# Patient Record
Sex: Male | Born: 2000 | Race: Black or African American | Hispanic: No | Marital: Single | Smoking: Current some day smoker
Health system: Southern US, Community
[De-identification: ages and names within clinical notes are randomized; demographics above are authoritative.]

## PROBLEM LIST (undated history)

## (undated) DIAGNOSIS — B20 Human immunodeficiency virus [HIV] disease: Secondary | ICD-10-CM

## (undated) DIAGNOSIS — Z21 Asymptomatic human immunodeficiency virus [HIV] infection status: Secondary | ICD-10-CM

---
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
## 2020-01-27 ENCOUNTER — Other Ambulatory Visit: Payer: Self-pay

## 2020-01-27 DIAGNOSIS — Z008 Encounter for other general examination: Secondary | ICD-10-CM | POA: Insufficient documentation

## 2020-01-27 DIAGNOSIS — R439 Unspecified disturbances of smell and taste: Secondary | ICD-10-CM | POA: Insufficient documentation

## 2020-01-27 DIAGNOSIS — Z5321 Procedure and treatment not carried out due to patient leaving prior to being seen by health care provider: Secondary | ICD-10-CM | POA: Diagnosis not present

## 2020-01-27 NOTE — ED Triage Notes (Signed)
Pt arrive to ED via POV from home with c/o "no taste or smell" since last "Thursday or Friday". Pt denies CP, SHOB. No c/o N/V/D or fever. Pt is here with his s/o other and requesting a COVID test. Pt informed at the registration desk that COVID testing is not normally done in the ED for routine screening reasons, at which point pt decided to check in "with symptoms" Pt is A&O, in NAD; RR even, regular, and unlabored.

## 2020-01-28 ENCOUNTER — Emergency Department
Admission: EM | Admit: 2020-01-28 | Discharge: 2020-01-28 | Disposition: A | Discharge status: Home / Self Care | Payer: Medicaid Other | Attending: Emergency Medicine | Admitting: Emergency Medicine

## 2020-01-28 HISTORY — DX: Asymptomatic human immunodeficiency virus (hiv) infection status: Z21

## 2020-01-28 HISTORY — DX: Human immunodeficiency virus (HIV) disease: B20

## 2020-07-13 ENCOUNTER — Other Ambulatory Visit
Admission: RE | Admit: 2020-07-13 | Discharge: 2020-07-13 | Disposition: A | Discharge status: Home / Self Care | Payer: Medicaid Other | Attending: Thoracic Surgery | Admitting: Thoracic Surgery

## 2020-07-13 ENCOUNTER — Other Ambulatory Visit: Payer: Self-pay

## 2020-07-13 DIAGNOSIS — F909 Attention-deficit hyperactivity disorder, unspecified type: Secondary | ICD-10-CM | POA: Diagnosis not present

## 2020-07-13 DIAGNOSIS — Z21 Asymptomatic human immunodeficiency virus [HIV] infection status: Secondary | ICD-10-CM | POA: Diagnosis not present

## 2020-07-13 DIAGNOSIS — Z0271 Encounter for disability determination: Secondary | ICD-10-CM | POA: Diagnosis not present

## 2020-07-14 LAB — HELPER T-LYMPH-CD4 (ARMC ONLY)
% CD 4 Pos. Lymph.: 28.3 % — ABNORMAL LOW (ref 30.8–58.5)
Absolute CD 4 Helper: 594 /uL (ref 359–1519)
Basophils Absolute: 0 10*3/uL (ref 0.0–0.2)
Basos: 1 %
EOS (ABSOLUTE): 0 10*3/uL (ref 0.0–0.4)
Eos: 0 %
Hematocrit: 41.6 % (ref 37.5–51.0)
Hemoglobin: 13.9 g/dL (ref 13.0–17.7)
Immature Grans (Abs): 0 10*3/uL (ref 0.0–0.1)
Immature Granulocytes: 0 %
Lymphocytes Absolute: 2.1 10*3/uL (ref 0.7–3.1)
Lymphs: 33 %
MCH: 27 pg (ref 26.6–33.0)
MCHC: 33.4 g/dL (ref 31.5–35.7)
MCV: 81 fL (ref 79–97)
Monocytes Absolute: 0.7 10*3/uL (ref 0.1–0.9)
Monocytes: 10 %
Neutrophils Absolute: 3.6 10*3/uL (ref 1.4–7.0)
Neutrophils: 56 %
Platelets: 352 10*3/uL (ref 150–450)
RBC: 5.14 x10E6/uL (ref 4.14–5.80)
RDW: 14.6 % (ref 11.6–15.4)
WBC: 6.4 10*3/uL (ref 3.4–10.8)

## 2020-08-07 ENCOUNTER — Other Ambulatory Visit
Admission: RE | Admit: 2020-08-07 | Discharge: 2020-08-07 | Disposition: A | Discharge status: Home / Self Care | Payer: Medicaid Other | Source: Ambulatory Visit | Attending: Thoracic Surgery | Admitting: Thoracic Surgery

## 2020-08-07 DIAGNOSIS — Z0271 Encounter for disability determination: Secondary | ICD-10-CM | POA: Insufficient documentation

## 2020-08-07 DIAGNOSIS — Z21 Asymptomatic human immunodeficiency virus [HIV] infection status: Secondary | ICD-10-CM | POA: Insufficient documentation

## 2020-08-07 DIAGNOSIS — F909 Attention-deficit hyperactivity disorder, unspecified type: Secondary | ICD-10-CM | POA: Diagnosis not present

## 2020-08-08 LAB — MISC LABCORP TEST (SEND OUT)
LabCorp test name: 505008
Labcorp test code: 505008

## 2020-09-25 ENCOUNTER — Emergency Department
Admission: EM | Admit: 2020-09-25 | Discharge: 2020-09-25 | Disposition: A | Discharge status: Home / Self Care | Payer: Medicaid Other | Attending: Emergency Medicine | Admitting: Emergency Medicine

## 2020-09-25 ENCOUNTER — Other Ambulatory Visit: Payer: Self-pay

## 2020-09-25 ENCOUNTER — Encounter: Payer: Self-pay | Admitting: Intensive Care

## 2020-09-25 DIAGNOSIS — F17210 Nicotine dependence, cigarettes, uncomplicated: Secondary | ICD-10-CM | POA: Diagnosis not present

## 2020-09-25 DIAGNOSIS — Z4802 Encounter for removal of sutures: Secondary | ICD-10-CM | POA: Diagnosis not present

## 2020-09-25 NOTE — ED Provider Notes (Addendum)
[facility] Emergency Department Provider Note  ____________________________________________   First MD Initiated Contact with Patient 09/25/20 1828     (approximate)  I have reviewed the triage vital signs and the nursing notes.   HISTORY  Chief Complaint Suture / Staple Removal   HPI Todd Dodson is a 19 y.o. male with past medical history of HIV who presents requesting suture removal after he had sutures placed in the fourth right digit for an isolated laceration sustained approximately 2 weeks ago. Patient denies any other injuries at that time states he is otherwise been in his usual state of health has not had any recent fevers, chills, redness around the site where the sutures were placed, weakness, numbness, cough, chest pain abdominal pain, vomiting, diarrhea, or other acute complaints.         Past Medical History:  Diagnosis Date  . HIV (human immunodeficiency virus infection) [facility])     Patient Active Problem List   Diagnosis Date Noted  . Lower urinary tract infectious disease 09/07/2019  . Elevated lipase 09/07/2019    History reviewed. No pertinent surgical history.  Prior to Admission medications   Medication Sig Start Date End Date Taking? Authorizing Provider  cyclobenzaprine (FLEXERIL) 5 MG tablet Take 1 tablet (5 mg total) by mouth 3 (three) times daily as needed for muscle spasms. 08/10/17   Cuthriell, Delorise Royals, PA-C  naproxen (NAPROSYN) 500 MG tablet Take 1 tablet (500 mg total) by mouth 2 (two) times daily with a meal. 08/10/17   Cuthriell, Delorise Royals, PA-C    Allergies Patient has no known allergies.  History reviewed. No pertinent family history.  Social History Social History   Tobacco Use  . Smoking status: Current Some Day Smoker    Types: Cigarettes  . Smokeless tobacco: Never Used  Substance Use Topics  . Alcohol use: Not Currently  . Drug use: Never    Review of Systems  Review of Systems    Constitutional: Negative for chills and fever.  HENT: Negative for sore throat.   Eyes: Negative for pain.  Respiratory: Negative for cough and stridor.   Cardiovascular: Negative for chest pain.  Gastrointestinal: Negative for vomiting.  Genitourinary: Negative for dysuria.  Skin: Negative for rash.  Neurological: Negative for seizures, loss of consciousness and headaches.  Psychiatric/Behavioral: Negative for suicidal ideas.  All other systems reviewed and are negative.     ____________________________________________   PHYSICAL EXAM:  VITAL SIGNS: ED Triage Vitals [09/25/20 1646]  Enc Vitals Group     BP 125/66     Pulse Rate 71     Resp 16     Temp 98.5 F (36.9 C)     Temp Source Oral     SpO2 100 %     Weight 168 lb (76.2 kg)     Height 6\' 3"  (1.905 m)     Head Circumference      Peak Flow      Pain Score 0     Pain Loc      Pain Edu?      Excl. in GC?    Vitals:   09/25/20 1646  BP: 125/66  Pulse: 71  Resp: 16  Temp: 98.5 F (36.9 C)  SpO2: 100%   Physical Exam Vitals and nursing note reviewed.  Constitutional:      Appearance: He is well-developed.  HENT:     Head: Normocephalic and atraumatic.     Right Ear: External ear normal.  Left Ear: External ear normal.     Nose: Nose normal.  Eyes:     Conjunctiva/sclera: Conjunctivae normal.  Cardiovascular:     Rate and Rhythm: Normal rate and regular rhythm.     Pulses: Normal pulses.  Pulmonary:     Effort: Pulmonary effort is normal. No respiratory distress.  Abdominal:     Palpations: Abdomen is soft.     Tenderness: There is no abdominal tenderness.  Musculoskeletal:     Cervical back: Neck supple.  Skin:    General: Skin is warm and dry.     Capillary Refill: Capillary refill takes less than 2 seconds.  Neurological:     Mental Status: He is alert and oriented to person, place, and time.  Psychiatric:        Mood and Affect: Mood normal.     7 sutures were in place over a  linear well-healing laceration over the dorsum of the right fourth digit that just crosses the proximal phalangeal joint. Patient is able to flex and extend the digit against resistance. No other trauma visible to any other digits or in the hand. Sensation intact in the distribution of the radial ulnar and median nerves. No fluctuance streaking warmth or other overlying skin changes. ____________________________________________    ____________________________________________   PROCEDURES  Procedure(s) performed (including Critical Care):  .Suture Removal  Date/Time: 09/25/2020 6:38 PM Performed by: Gilles Chiquito, MD Authorized by: Gilles Chiquito, MD   Consent:    Consent obtained:  Verbal   Consent given by:  Patient   Risks discussed:  Pain and wound separation   Alternatives discussed:  No treatment and referral Location:    Location:  Upper extremity   Upper extremity location:  Hand   Hand location:  R ring finger Procedure details:    Wound appearance:  No signs of infection   Number of sutures removed:  7 Post-procedure details:    Patient tolerance of procedure:  Tolerated well, no immediate complications Comments:     Like appears approximately 1 cm in length.     ____________________________________________   INITIAL IMPRESSION / ASSESSMENT AND PLAN / ED COURSE      Patient presents above to history exam for assessment requesting suture removal after he had these placed proximally 2 weeks ago for an isolated left over his right fourth digit. Patient is afebrile hemodynamically stable arrival. No signs of infection on exam and patient is otherwise neurovascular intact in the hand. Sutures removed per procedure note above. Patient discharged stable condition. Strict return precautions advised and discussed.   ____________________________________________   FINAL CLINICAL IMPRESSION(S) / ED DIAGNOSES  Final diagnoses:  Visit for suture removal     Medications - No data to display   ED Discharge Orders    None       Note:  This document was prepared using Dragon voice recognition software and may include unintentional dictation errors.   Gilles Chiquito, MD 09/25/20 1839    Gilles Chiquito, MD 09/25/20 1840

## 2020-09-25 NOTE — ED Triage Notes (Signed)
Patient here for suture removal.

## 2021-02-02 ENCOUNTER — Emergency Department: Payer: Medicaid Other

## 2021-02-02 ENCOUNTER — Encounter: Payer: Self-pay | Admitting: Emergency Medicine

## 2021-02-02 ENCOUNTER — Other Ambulatory Visit: Payer: Self-pay

## 2021-02-02 ENCOUNTER — Emergency Department
Admission: EM | Admit: 2021-02-02 | Discharge: 2021-02-02 | Disposition: A | Discharge status: Home / Self Care | Payer: Medicaid Other | Attending: Emergency Medicine | Admitting: Emergency Medicine

## 2021-02-02 DIAGNOSIS — Z21 Asymptomatic human immunodeficiency virus [HIV] infection status: Secondary | ICD-10-CM | POA: Diagnosis not present

## 2021-02-02 DIAGNOSIS — S0993XA Unspecified injury of face, initial encounter: Secondary | ICD-10-CM | POA: Diagnosis present

## 2021-02-02 DIAGNOSIS — S0302XA Dislocation of jaw, left side, initial encounter: Secondary | ICD-10-CM | POA: Insufficient documentation

## 2021-02-02 DIAGNOSIS — S0300XA Dislocation of jaw, unspecified side, initial encounter: Secondary | ICD-10-CM

## 2021-02-02 DIAGNOSIS — H1031 Unspecified acute conjunctivitis, right eye: Secondary | ICD-10-CM | POA: Diagnosis not present

## 2021-02-02 DIAGNOSIS — F17210 Nicotine dependence, cigarettes, uncomplicated: Secondary | ICD-10-CM | POA: Diagnosis not present

## 2021-02-02 MED ORDER — TETRACAINE HCL 0.5 % OP SOLN
2.0000 [drp] | Freq: Once | OPHTHALMIC | Status: AC
  Administered 2021-02-02: 2 [drp] via OPHTHALMIC
  Filled 2021-02-02: qty 4

## 2021-02-02 MED ORDER — FLUORESCEIN SODIUM 1 MG OP STRP
1.0000 | ORAL_STRIP | Freq: Once | OPHTHALMIC | Status: AC
  Administered 2021-02-02: 1 via OPHTHALMIC
  Filled 2021-02-02: qty 1

## 2021-02-02 MED ORDER — KETOROLAC TROMETHAMINE 0.5 % OP SOLN: 1.0000 [drp] | Freq: Four times a day (QID) | OPHTHALMIC | 0 refills | Status: DC

## 2021-02-02 MED ORDER — GENTAMICIN SULFATE 0.3 % OP SOLN: 1.0000 [drp] | Freq: Three times a day (TID) | OPHTHALMIC | 0 refills | Status: AC

## 2021-02-02 NOTE — Discharge Instructions (Addendum)
You are being treated for a bacterial conjunctivitis. Use the antibiotic drops and anti-inflammatory drops as directed. Follow-up with [facility] as needed. See [facility] for your jaw pain. Return to the ED if necessary.
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
## 2021-02-02 NOTE — ED Notes (Addendum)
See triage note  presents with irritation to right eye   Right eye is red with some swelling  Thinks he may have slept with his contacts   States he knew that the left contact fell out but unsure of right
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
## 2021-02-02 NOTE — ED Triage Notes (Signed)
Wears contacts, sometimes sleeps with contacts in.  Unsure if contact in still in eye.  Today c/o eye swelling and redness.  AAOx3.  Skin warm and dry. NAD
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
## 2022-02-09 ENCOUNTER — Other Ambulatory Visit: Payer: Self-pay

## 2022-02-09 ENCOUNTER — Ambulatory Visit: Payer: Medicaid Other | Admitting: Family Medicine

## 2022-02-09 ENCOUNTER — Encounter: Payer: Self-pay | Admitting: Family Medicine

## 2022-02-09 DIAGNOSIS — F32A Depression, unspecified: Secondary | ICD-10-CM

## 2022-02-09 DIAGNOSIS — Z113 Encounter for screening for infections with a predominantly sexual mode of transmission: Secondary | ICD-10-CM

## 2022-02-09 DIAGNOSIS — A539 Syphilis, unspecified: Secondary | ICD-10-CM

## 2022-02-09 DIAGNOSIS — F419 Anxiety disorder, unspecified: Secondary | ICD-10-CM

## 2022-02-09 MED ORDER — PENICILLIN G BENZATHINE 2400000 UNIT/4ML IM SUSP: 2.4000 10*6.[IU] | Freq: Once | INTRAMUSCULAR | Status: DC

## 2022-02-09 MED ORDER — PENICILLIN G BENZATHINE 1200000 UNIT/2ML IM SUSY
2.4000 10*6.[IU] | PREFILLED_SYRINGE | Freq: Once | INTRAMUSCULAR | Status: AC
  Administered 2022-02-09: 2.4 10*6.[IU] via INTRAMUSCULAR

## 2022-02-09 NOTE — Progress Notes (Signed)
Pt here for treatment of Syphilis.  Bicillin 2.4 MU given IM without any complications.  Condoms given.  Berdie Ogren, RN

## 2022-02-09 NOTE — Progress Notes (Signed)
°  S: Patient in clinic today for treatment of syphilis.  Pt was sent for treatment from  ID provider ([facility]).    O: RPR 1:4  on 02/02/2021, previous result on 07/26/2021 was non reactive.    A: 1. Screening examination for venereal disease      2. Anxiety and depression       - Ambulatory referral to Behavioral Health      3. Syphilis       - penicillin g benzathine (BICILLIN LA) 1200000 UNIT/2ML injection 2.4 Million Units  P:Pt to be treated with 2.4 million units Bicilin IM.  Patient tolerated treatment well. Pt monitored for 15 minutes. Pt to be re-screened in 6 months.  Instructed no sex until 14 days after treatment and partner treated.   Patient to call if questions or concerns.    Wendi Snipes, FNP

## 2022-03-10 ENCOUNTER — Ambulatory Visit: Payer: Disability Insurance | Admitting: Licensed Clinical Social Worker

## 2022-04-23 ENCOUNTER — Emergency Department: Payer: Medicaid Other

## 2022-04-23 ENCOUNTER — Encounter: Payer: Self-pay | Admitting: Emergency Medicine

## 2022-04-23 ENCOUNTER — Emergency Department
Admission: EM | Admit: 2022-04-23 | Discharge: 2022-04-23 | Disposition: A | Discharge status: Home / Self Care | Payer: Medicaid Other | Attending: Emergency Medicine | Admitting: Emergency Medicine

## 2022-04-23 ENCOUNTER — Other Ambulatory Visit: Payer: Self-pay

## 2022-04-23 DIAGNOSIS — A549 Gonococcal infection, unspecified: Secondary | ICD-10-CM | POA: Insufficient documentation

## 2022-04-23 DIAGNOSIS — A749 Chlamydial infection, unspecified: Secondary | ICD-10-CM | POA: Insufficient documentation

## 2022-04-23 DIAGNOSIS — S5001XA Contusion of right elbow, initial encounter: Secondary | ICD-10-CM | POA: Diagnosis not present

## 2022-04-23 DIAGNOSIS — W109XXA Fall (on) (from) unspecified stairs and steps, initial encounter: Secondary | ICD-10-CM | POA: Insufficient documentation

## 2022-04-23 DIAGNOSIS — S59901A Unspecified injury of right elbow, initial encounter: Secondary | ICD-10-CM | POA: Diagnosis present

## 2022-04-23 LAB — CHLAMYDIA/NGC RT PCR (ARMC ONLY)
Chlamydia Tr: DETECTED — AB
N gonorrhoeae: DETECTED — AB

## 2022-04-23 MED ORDER — DOXYCYCLINE HYCLATE 100 MG PO TABS: 100.0000 mg | ORAL_TABLET | Freq: Two times a day (BID) | ORAL | 0 refills | Status: AC

## 2022-04-23 MED ORDER — CEFTRIAXONE SODIUM 1 G IJ SOLR
1.0000 g | Freq: Once | INTRAMUSCULAR | Status: AC
  Administered 2022-04-23: 1 g via INTRAMUSCULAR
  Filled 2022-04-23: qty 10

## 2022-04-23 NOTE — ED Triage Notes (Signed)
Pt reports fell 2 days ago while helping his mother move and his right elbow is still numb and tingling. Pt also reports would like to get a STD check.  ?

## 2022-04-23 NOTE — Discharge Instructions (Signed)
You were seen today for right elbow pain and swelling.  Your x-ray did not show any evidence of acute fracture.  This is likely just soft tissue injury and bruising.  You may apply ice for 10 minutes 2 times daily to help reduce pain and swelling.  You tested positive for gonorrhea and chlamydia.  You received a Rocephin injection and are being put on an oral antibiotic twice daily for the next 14 days.  Please do not have sexual intercourse for the next week.  When you resume sexual activity, please use safe sexual practices. ?

## 2022-04-23 NOTE — ED Provider Notes (Signed)
? ?[facility] ?Provider Note ? ? ? Event Date/Time  ? First MD Initiated Contact with Patient 04/23/22 1345   ?  (approximate) ? ? ?History  ? ?Fall, Arm Injury, and SEXUALLY TRANSMITTED DISEASE ? ? ?HPI ? ?Todd Dodson is a 21 y.o. male presents to the ER today with complaint of right elbow pain and swelling.  He reports this started 2 days ago after falling down approximately 4 steps.  He reports he landed directly on his right elbow.  He describes the pain as sharp with associated numbness and tingling.  He denies weakness.  He denies any shoulder or wrist pain on the right side.  He did apply some ice to the area with minimal relief of symptoms.  He has not taken anything OTC for this. ? ?He would also like STD screening.  He reports positive exposure to chlamydia.  He reports penile discharge but denies lower abdominal pain, urinary frequency, urgency, dysuria, penile lesion, testicular pain or swelling.  He is HIV positive. ? ?  ? ? ?Physical Exam  ? ?Triage Vital Signs: ?ED Triage Vitals  ?Enc Vitals Group  ?   BP 04/23/22 1344 123/72  ?   Pulse Rate 04/23/22 1344 71  ?   Resp 04/23/22 1344 16  ?   Temp 04/23/22 1344 98.2 ?F (36.8 ?C)  ?   Temp Source 04/23/22 1344 Oral  ?   SpO2 04/23/22 1344 99 %  ?   Weight 04/23/22 1340 165 lb (74.8 kg)  ?   Height 04/23/22 1340 6\' 3"  (1.905 m)  ?   Head Circumference --   ?   Peak Flow --   ?   Pain Score 04/23/22 1340 0  ?   Pain Loc --   ?   Pain Edu? --   ?   Excl. in GC? --   ? ? ?Most recent vital signs: ?Vitals:  ? 04/23/22 1344  ?BP: 123/72  ?Pulse: 71  ?Resp: 16  ?Temp: 98.2 ?F (36.8 ?C)  ?SpO2: 99%  ? ? ?General: Awake, no distress.  ?CV:  RRR, radial pulse 2+ on the right ?Resp:  CTA bilaterally. ?MSK:  Normal flexion, extension and rotation of the right elbow.  1+ swelling noted over the right elbow.  Pain with palpation over the olecranon. ?Abd:  Soft and nontender. ?GU:  Normal male anatomy.  Green discharge noted from the urethral  meatus. ? ? ?ED Results / Procedures / Treatments  ? ?Labs ? ?Labs Reviewed  ?CHLAMYDIA/NGC RT PCR ([facility] ONLY)           - Abnormal; Notable for the following components:  ?    Result Value  ? Chlamydia Tr DETECTED (*)   ? N gonorrhoeae DETECTED (*)   ? All other components within normal limits  ? ? ? ?RADIOLOGY ? ?Imaging Orders    ?     DG Elbow 2 Views Right    ? ?IMPRESSION: Negative.  ? ? ?MEDICATIONS ORDERED IN ED: ? ? ?IMPRESSION / MDM / ASSESSMENT AND PLAN / ED COURSE  ?I reviewed the triage vital signs and the nursing notes. ? ?Right Elbow Pain and Swelling, Penile Discharge: ? ?Differential diagnosis includes, but is not limited to, elbow contusion, elbow fracture, STI, UTI ? ?Xray right elbow does not show any acute fracture but does show soft tissue swelling per my read, confirmed by radiology ?Urine gonorrhea/chlamydia positive for both ?Encouraged ice to the elbow ?Rocephin 1 g IM  x1 ?Rx for Doxycycline 100 mg 2 times a day for 14 days ? ? ?FINAL CLINICAL IMPRESSION(S) / ED DIAGNOSES  ? ?Final diagnoses:  ?Contusion of right elbow, initial encounter  ?Gonorrhea  ?Chlamydia  ? ? ? ?Rx / DC Orders  ? ?ED Discharge Orders   ? ?      Ordered  ?  doxycycline (VIBRA-TABS) 100 MG tablet  2 times daily       ? 04/23/22 1604  ? ?  ?  ? ?  ? ? ? ?Note:  This document was prepared using Dragon voice recognition software and may include unintentional dictation errors. ? ?  ?Lorre Munroe, NP ?04/23/22 1605 ? ?  ?Merwyn Katos, MD ?04/23/22 1642 ? ?

## 2024-01-18 ENCOUNTER — Encounter (HOSPITAL_BASED_OUTPATIENT_CLINIC_OR_DEPARTMENT_OTHER): Payer: Self-pay | Admitting: Emergency Medicine

## 2024-01-18 ENCOUNTER — Other Ambulatory Visit: Payer: Self-pay

## 2024-01-18 ENCOUNTER — Emergency Department (HOSPITAL_BASED_OUTPATIENT_CLINIC_OR_DEPARTMENT_OTHER): Payer: Medicaid Other

## 2024-01-18 ENCOUNTER — Emergency Department (HOSPITAL_BASED_OUTPATIENT_CLINIC_OR_DEPARTMENT_OTHER)
Admission: EM | Admit: 2024-01-18 | Discharge: 2024-01-18 | Disposition: A | Discharge status: Home / Self Care | Payer: Medicaid Other | Attending: Emergency Medicine | Admitting: Emergency Medicine

## 2024-01-18 DIAGNOSIS — Z20822 Contact with and (suspected) exposure to covid-19: Secondary | ICD-10-CM | POA: Insufficient documentation

## 2024-01-18 DIAGNOSIS — J0110 Acute frontal sinusitis, unspecified: Secondary | ICD-10-CM | POA: Diagnosis not present

## 2024-01-18 DIAGNOSIS — Z21 Asymptomatic human immunodeficiency virus [HIV] infection status: Secondary | ICD-10-CM | POA: Diagnosis not present

## 2024-01-18 DIAGNOSIS — R220 Localized swelling, mass and lump, head: Secondary | ICD-10-CM | POA: Diagnosis present

## 2024-01-18 LAB — BASIC METABOLIC PANEL
Anion gap: 8 (ref 5–15)
BUN: <5 mg/dL — ABNORMAL LOW (ref 6–20)
CO2: 28 mmol/L (ref 22–32)
Calcium: 9.4 mg/dL (ref 8.9–10.3)
Chloride: 103 mmol/L (ref 98–111)
Creatinine, Ser: 1.04 mg/dL (ref 0.61–1.24)
GFR, Estimated: >60 mL/min (ref >60)
Glucose, Bld: 89 mg/dL (ref 70–99)
Potassium: 4 mmol/L (ref 3.5–5.1)
Sodium: 139 mmol/L (ref 135–145)

## 2024-01-18 LAB — CBC WITH DIFFERENTIAL/PLATELET
Abs Immature Granulocytes: 0.03 10*3/uL (ref 0.00–0.07)
Basophils Absolute: 0 10*3/uL (ref 0.0–0.1)
Basophils Relative: 0 %
Eosinophils Absolute: 0 10*3/uL (ref 0.0–0.5)
Eosinophils Relative: 0 %
HCT: 42.7 % (ref 39.0–52.0)
Hemoglobin: 13.6 g/dL (ref 13.0–17.0)
Immature Granulocytes: 0 %
Lymphocytes Relative: 16 %
Lymphs Abs: 1.5 10*3/uL (ref 0.7–4.0)
MCH: 25.7 pg — ABNORMAL LOW (ref 26.0–34.0)
MCHC: 31.9 g/dL (ref 30.0–36.0)
MCV: 80.6 fL (ref 80.0–100.0)
Monocytes Absolute: 0.4 10*3/uL (ref 0.1–1.0)
Monocytes Relative: 5 %
Neutro Abs: 7.2 10*3/uL (ref 1.7–7.7)
Neutrophils Relative %: 79 %
Platelets: 370 10*3/uL (ref 150–400)
RBC: 5.3 MIL/uL (ref 4.22–5.81)
RDW: 14.2 % (ref 11.5–15.5)
WBC: 9.3 10*3/uL (ref 4.0–10.5)
nRBC: 0 % (ref 0.0–0.2)

## 2024-01-18 LAB — RESP PANEL BY RT-PCR (RSV, FLU A&B, COVID)  RVPGX2
Influenza A by PCR: NEGATIVE
Influenza B by PCR: NEGATIVE
Resp Syncytial Virus by PCR: NEGATIVE
SARS Coronavirus 2 by RT PCR: NEGATIVE

## 2024-01-18 MED ORDER — AMOXICILLIN-POT CLAVULANATE ER 1000-62.5 MG PO TB12: 2.0000 | ORAL_TABLET | Freq: Two times a day (BID) | ORAL | 0 refills | Status: AC

## 2024-01-18 MED ORDER — IOHEXOL 300 MG/ML  SOLN
100.0000 mL | Freq: Once | INTRAMUSCULAR | Status: AC | PRN
  Administered 2024-01-18: 75 mL via INTRAVENOUS

## 2024-01-18 MED ORDER — KETOROLAC TROMETHAMINE 15 MG/ML IJ SOLN
15.0000 mg | Freq: Once | INTRAMUSCULAR | Status: AC
  Administered 2024-01-18: 15 mg via INTRAVENOUS
  Filled 2024-01-18: qty 1

## 2024-01-18 MED ORDER — AMOXICILLIN-POT CLAVULANATE 875-125 MG PO TABS
1.0000 | ORAL_TABLET | Freq: Two times a day (BID) | ORAL | 0 refills | Status: DC
Start: 2024-01-18 — End: 2024-01-18

## 2024-01-18 NOTE — ED Provider Notes (Signed)
[place] EMERGENCY DEPARTMENT AT [facility] Provider Note   CSN: [id number] Arrival date & time: 01/18/24  1255     History  Chief Complaint  Patient presents with   Headache    Todd Dodson is a 23 y.o. male, Hu of HIV, who presents to the ED secondary to left facial swelling, pain, this been going on for the last day.  He states that it has progressively been getting worse throughout the day, and is states the pain is a 10 out of 10.  He states his pain was similar, when he had a fungal sinus infection a couple years back.  Is on HIV medication, however has not been compliant.  Has missed 2 weeks of HIV medication, and just restarted it last week.  He denies any fevers, chills, but states his headache is severe.     Home Medications Prior to Admission medications   Medication Sig Start Date End Date Taking? Authorizing Provider  amoxicillin-clavulanate (AUGMENTIN) 875-125 MG tablet Take 1 tablet by mouth every 12 (twelve) hours. 01/18/24  Yes Kennith Morss L, PA  Glecaprevir-Pibrentasvir (MAVYRET) 100-40 MG TABS Take 3 tablets by mouth daily. 01/16/24  Yes [provider]  bictegravir-emtricitabine-tenofovir AF (BIKTARVY) 50-200-25 MG TABS tablet Take by mouth daily.    [provider]  doxycycline (VIBRA-TABS) 100 MG tablet Take 1 tablet (100 mg total) by mouth 2 (two) times daily. 04/23/22   Lorre Munroe, NP      Allergies    Patient has no known allergies.    Review of Systems   Review of Systems  Constitutional:  Negative for fever.  Neurological:  Positive for headaches.    Physical Exam Updated Vital Signs BP 128/69 (BP Location: Right Arm)   Pulse 82   Temp 97.7 F (36.5 C) (Oral)   Resp 18   SpO2 100%  Physical Exam Vitals and nursing note reviewed.  Constitutional:      General: He is not in acute distress.    Appearance: He is well-developed.  HENT:     Head: Normocephalic and atraumatic.     Comments: +L frontal sinus  ttp w/L sphenoid sinus ttp. Slight edema to upper eyelid w/o erythema.     Right Ear: Tympanic membrane normal.     Left Ear: Tympanic membrane normal.     Ears:     Comments: No erythema noted Eyes:     Extraocular Movements: Extraocular movements intact.     Conjunctiva/sclera: Conjunctivae normal.     Pupils: Pupils are equal, round, and reactive to light.  Cardiovascular:     Rate and Rhythm: Normal rate and regular rhythm.     Heart sounds: No murmur heard. Pulmonary:     Effort: Pulmonary effort is normal. No respiratory distress.     Breath sounds: Normal breath sounds.  Abdominal:     Palpations: Abdomen is soft.     Tenderness: There is no abdominal tenderness.  Musculoskeletal:        General: No swelling.     Cervical back: Neck supple.  Skin:    General: Skin is warm and dry.     Capillary Refill: Capillary refill takes less than 2 seconds.  Neurological:     Mental Status: He is alert.  Psychiatric:        Mood and Affect: Mood normal.     ED Results / Procedures / Treatments   Labs (all labs ordered are listed, but only abnormal results are displayed) Labs  Reviewed  CBC WITH DIFFERENTIAL/PLATELET - Abnormal; Notable for the following components:      Result Value   MCH 25.7 (*)    All other components within normal limits  BASIC METABOLIC PANEL - Abnormal; Notable for the following components:   BUN <5 (*)    All other components within normal limits  RESP PANEL BY RT-PCR (RSV, FLU A&B, COVID)  [id number]    EKG None  Radiology CT Maxillofacial W Contrast Result Date: 01/18/2024 CLINICAL DATA:  Fungal sinusitis.  Severe headache.  HIV positive. EXAM: CT MAXILLOFACIAL WITH CONTRAST TECHNIQUE: Multidetector CT imaging of the maxillofacial structures was performed with intravenous contrast. Multiplanar CT image reconstructions were also generated. RADIATION DOSE REDUCTION: This exam was performed according to the departmental dose-optimization program which  includes automated exposure control, adjustment of the mA and/or kV according to patient size and/or use of iterative reconstruction technique. CONTRAST:  75mL OMNIPAQUE IOHEXOL 300 MG/ML  SOLN COMPARISON:  CT head without contrast 01/18/2024. CT maxillofacial 02/02/2021. FINDINGS: Osseous: No acute osseous lesions are present. No acute or healing fractures are present. Orbits: Left supraorbital scalp soft tissue swelling is present without focal abscess. Sinuses: Diffuse opacification of the anterior left paranasal sinuses is present. Circumferential mucosal thickening with near complete opacification of the left maxillary sinus is present. Mucosal enhancement is evident. Diffuse scratched at scattered opacification of left ethmoid air cells is present. The left frontal sinus is near completely opacified. No osseous destruction is present. A polyp or mucous retention cyst is present at the inferior right maxillary sinus. The right paranasal sinuses are otherwise clear. Mastoid air cells are clear. Soft tissues: The soft tissues scratched at extracranial soft tissues are otherwise within normal limits. Limited intracranial: No pathologic intracranial enhancement is present. IMPRESSION: 1. Diffuse opacification of the anterior left paranasal sinuses is compatible with sinusitis. 2. No osseous destruction to suggest a more acute fungal sinusitis. 3. Left supraorbital scalp soft tissue swelling without focal abscess. 4. Polyp or mucous retention cyst at the inferior right maxillary sinus. 5. Normal postcontrast appearance of the visualized intracranial contents. Electronically Signed   By: Marin Roberts M.D.   On: 01/18/2024 18:22   CT Head Wo Contrast Result Date: 01/18/2024 CLINICAL DATA:  Headache EXAM: CT HEAD WITHOUT CONTRAST TECHNIQUE: Contiguous axial images were obtained from the base of the skull through the vertex without intravenous contrast. RADIATION DOSE REDUCTION: This exam was performed  according to the departmental dose-optimization program which includes automated exposure control, adjustment of the mA and/or kV according to patient size and/or use of iterative reconstruction technique. COMPARISON:  None Available. FINDINGS: Brain: No acute territorial infarction, hemorrhage or intracranial mass. The ventricles are nonenlarged Vascular: No hyperdense vessel or unexpected calcification. Skull: Normal. Negative for fracture or focal lesion. Sinuses/Orbits: Extensive opacification of the left maxillary, ethmoid and frontal sinus. Other: None IMPRESSION: 1. Negative non contrasted CT appearance of the brain. 2. Extensive left-sided sinus disease. Electronically Signed   By: Jasmine Pang M.D.   On: 01/18/2024 18:15    Procedures Procedures    Medications Ordered in ED Medications  ketorolac (TORADOL) 15 MG/ML injection 15 mg (15 mg Intravenous Given 01/18/24 1702)  iohexol (OMNIPAQUE) 300 MG/ML solution 100 mL (75 mLs Intravenous Contrast Given 01/18/24 1758)    ED Course/ Medical Decision Making/ A&P  Medical Decision Making Patient is a 23 year old male, here for left-sided facial pain, that is been going on for the last day.  He has no fevers, chills.  Notes that his eye is a little swollen.  Denies any vision changes.  He has swelling of his upper eyelid, as well as his brow.  Will obtain a CT max face, and CT head, given history of fungal sinusitis.  He has no fever currently.  Amount and/or Complexity of Data Reviewed Labs: ordered. Radiology: ordered.    Details: Extensive left-sided sinus disease, findings not concerning for fungal sinusitis  Discussion of management or test interpretation with external provider(s): CTs are reassuring, just concerning for bacterial sinusitis, we will treat with Augmentin, high-dose, and have him follow-up with his PCP d/t high risk given hx of HIV, complicated sinusitis, and extensive disease. Discussed  continuing HIV meds and return precautions.   Risk Prescription drug management.    Final Clinical Impression(s) / ED Diagnoses Final diagnoses:  Acute non-recurrent frontal sinusitis    Rx / DC Orders ED Discharge Orders          Ordered    amoxicillin-clavulanate (AUGMENTIN) 875-125 MG tablet  Every 12 hours        01/18/24 1834              Pete Pelt, PA 01/18/24 1840    Benjiman Core, MD 01/18/24 2104

## 2024-01-18 NOTE — Discharge Instructions (Signed)
You have a sinus infection, you are HIV positive, thus it is very important that you follow-up with a primary care doctor, or your infectious disease doctor, to make sure that the symptoms are getting better.  If they are getting worse, you may need IV antibiotics.  Take antibiotics as prescribed, and continue taking your HIV medication

## 2024-01-18 NOTE — ED Triage Notes (Signed)
Pt bib [facility], c/o HA x 2 days and reports eye felt "droopy".  Endorses hx of sinus infections

## 2024-03-04 ENCOUNTER — Ambulatory Visit: Payer: Medicaid Other | Admitting: Family Medicine

## 2024-04-21 ENCOUNTER — Emergency Department (HOSPITAL_COMMUNITY)
Admission: EM | Admit: 2024-04-21 | Discharge: 2024-04-21 | Disposition: A | Discharge status: Home / Self Care | Attending: Emergency Medicine | Admitting: Emergency Medicine

## 2024-04-21 ENCOUNTER — Emergency Department (HOSPITAL_COMMUNITY)

## 2024-04-21 ENCOUNTER — Other Ambulatory Visit: Payer: Self-pay

## 2024-04-21 ENCOUNTER — Encounter (HOSPITAL_COMMUNITY): Payer: Self-pay

## 2024-04-21 DIAGNOSIS — M545 Low back pain, unspecified: Secondary | ICD-10-CM | POA: Insufficient documentation

## 2024-04-21 DIAGNOSIS — R200 Anesthesia of skin: Secondary | ICD-10-CM | POA: Insufficient documentation

## 2024-04-21 DIAGNOSIS — Y92410 Unspecified street and highway as the place of occurrence of the external cause: Secondary | ICD-10-CM | POA: Insufficient documentation

## 2024-04-21 MED ORDER — METHOCARBAMOL 500 MG PO TABS
750.0000 mg | ORAL_TABLET | Freq: Once | ORAL | Status: AC
  Administered 2024-04-21: 750 mg via ORAL
  Filled 2024-04-21: qty 2

## 2024-04-21 MED ORDER — LIDOCAINE 5 % EX PTCH
1.0000 | MEDICATED_PATCH | Freq: Once | CUTANEOUS | Status: DC
  Administered 2024-04-21: 1 via TRANSDERMAL
  Filled 2024-04-21: qty 1

## 2024-04-21 MED ORDER — METHOCARBAMOL 500 MG PO TABS: 500.0000 mg | ORAL_TABLET | Freq: Three times a day (TID) | ORAL | 0 refills | Status: AC | PRN

## 2024-04-21 MED ORDER — LIDOCAINE 5 % EX PTCH: 1.0000 | MEDICATED_PATCH | CUTANEOUS | 0 refills | Status: AC

## 2024-04-21 MED ORDER — IBUPROFEN 400 MG PO TABS
600.0000 mg | ORAL_TABLET | Freq: Once | ORAL | Status: AC
  Administered 2024-04-21: 600 mg via ORAL
  Filled 2024-04-21: qty 1

## 2024-04-21 MED ORDER — ACETAMINOPHEN 500 MG PO TABS
1000.0000 mg | ORAL_TABLET | Freq: Once | ORAL | Status: AC
  Administered 2024-04-21: 1000 mg via ORAL
  Filled 2024-04-21: qty 2

## 2024-04-21 NOTE — Discharge Instructions (Addendum)
 You were seen in the ED for back pain with CT imaging performed.  This did not show any fractures.  You can take Tylenol  650 mg every 6 hours as needed for pain, ibuprofen  400 mg every 8 hours as needed for pain.  You can also apply lidocaine patches, and you can also take Robaxin every 8 hours as needed for muscle spasms.  Do not drive or operate heavy machinery if you take Robaxin.  Please return the ED for any emergency medical symptoms, including but not limited to persistent pain to the back with urinary or bowel incontinence, numbness or tingling to the genital area, weakness in the legs.  Please follow-up with PCP as needed.
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
